# Patient Record
Sex: Female | Born: 1960 | Race: Black or African American | Hispanic: No | Marital: Single | Smoking: Former smoker
Health system: Southern US, Community
[De-identification: ages and names within clinical notes are randomized; demographics above are authoritative.]

## PROBLEM LIST (undated history)

## (undated) DIAGNOSIS — I10 Essential (primary) hypertension: Secondary | ICD-10-CM

## (undated) DIAGNOSIS — E119 Type 2 diabetes mellitus without complications: Secondary | ICD-10-CM

## (undated) HISTORY — PX: ABDOMINAL HYSTERECTOMY: SHX81

## (undated) HISTORY — PX: KNEE SURGERY: SHX244

## (undated) HISTORY — PX: OTHER SURGICAL HISTORY: SHX169

---

## 2021-06-17 ENCOUNTER — Emergency Department (HOSPITAL_BASED_OUTPATIENT_CLINIC_OR_DEPARTMENT_OTHER): Payer: 59

## 2021-06-17 ENCOUNTER — Other Ambulatory Visit: Payer: Self-pay

## 2021-06-17 ENCOUNTER — Emergency Department (HOSPITAL_BASED_OUTPATIENT_CLINIC_OR_DEPARTMENT_OTHER)
Admission: EM | Admit: 2021-06-17 | Discharge: 2021-06-17 | Disposition: A | Discharge status: Home / Self Care | Payer: 59 | Attending: Emergency Medicine | Admitting: Emergency Medicine

## 2021-06-17 ENCOUNTER — Encounter (HOSPITAL_BASED_OUTPATIENT_CLINIC_OR_DEPARTMENT_OTHER): Payer: Self-pay

## 2021-06-17 DIAGNOSIS — E119 Type 2 diabetes mellitus without complications: Secondary | ICD-10-CM | POA: Diagnosis not present

## 2021-06-17 DIAGNOSIS — I10 Essential (primary) hypertension: Secondary | ICD-10-CM | POA: Insufficient documentation

## 2021-06-17 DIAGNOSIS — U071 COVID-19: Secondary | ICD-10-CM | POA: Insufficient documentation

## 2021-06-17 DIAGNOSIS — R059 Cough, unspecified: Secondary | ICD-10-CM | POA: Diagnosis present

## 2021-06-17 HISTORY — DX: Essential (primary) hypertension: I10

## 2021-06-17 HISTORY — DX: Type 2 diabetes mellitus without complications: E11.9

## 2021-06-17 MED ORDER — NIRMATRELVIR/RITONAVIR (PAXLOVID)TABLET: 3.0000 | ORAL_TABLET | Freq: Two times a day (BID) | ORAL | 0 refills | Status: AC

## 2021-06-17 NOTE — ED Notes (Signed)
ED Provider at bedside. 

## 2021-06-17 NOTE — ED Provider Notes (Signed)
[facility] EMERGENCY DEPARTMENT Provider Note   CSN: [id number] Arrival date & time: 06/17/21  2113     History  Chief Complaint  Patient presents with   Covid Positive    Gidget Quizhpi is a 61 y.o. female.  Patient is a 61 year old female with past medical history of diabetes, hypertension, obesity.  Patient presenting today for evaluation of URI symptoms.  She describes congestion, cough, and fever for the past 3 days.  She had a positive COVID test 2 days ago.  She denies to me that she is short of breath or having any chest pain.  There are no aggravating or alleviating factors.  The history is provided by the patient.      Home Medications Prior to Admission medications   Not on File      Allergies    Patient has no known allergies.    Review of Systems   Review of Systems  All other systems reviewed and are negative.  Physical Exam Updated Vital Signs BP (!) 181/97 (BP Location: Left Arm)    Pulse 70    Temp 98.9 F (37.2 C) (Oral)    Resp 18    SpO2 92%  Physical Exam Vitals and nursing note reviewed.  Constitutional:      General: She is not in acute distress.    Appearance: She is well-developed. She is not diaphoretic.  HENT:     Head: Normocephalic and atraumatic.  Cardiovascular:     Rate and Rhythm: Normal rate and regular rhythm.     Heart sounds: No murmur heard.   No friction rub. No gallop.  Pulmonary:     Effort: Pulmonary effort is normal. No respiratory distress.     Breath sounds: Normal breath sounds. No wheezing.  Abdominal:     General: Bowel sounds are normal. There is no distension.     Palpations: Abdomen is soft.     Tenderness: There is no abdominal tenderness.  Musculoskeletal:        General: Normal range of motion.     Cervical back: Normal range of motion and neck supple.  Skin:    General: Skin is warm and dry.  Neurological:     General: No focal deficit present.     Mental Status: She is alert and oriented  to person, place, and time.    ED Results / Procedures / Treatments   Labs (all labs ordered are listed, but only abnormal results are displayed) Labs Reviewed - No data to display  EKG None  Radiology DG Chest Portable 1 View  Result Date: 06/17/2021 CLINICAL DATA:  Positive COVID test, shortness of breath. EXAM: PORTABLE CHEST 1 VIEW COMPARISON:  PA and lateral chest 04/07/2021. FINDINGS: The heart is moderately enlarged. There is mild vascular distension without overt subpleural edema. Haziness in the left-greater-than-right lower lung fields is noted and could be due to pneumonitis or ground-glass edema, alternatively low lung volumes. No pleural effusion is seen. There is mild elevation of the right hemidiaphragm. Stable mediastinal configuration. IMPRESSION: 1. Cardiomegaly with mild vascular distention. 2. Increased haziness left-greater-than-right lower lung fields, could be due to low lung volumes, pneumonitis or ground-glass edema. PA and lateral study would be helpful. Electronically Signed   By: Almira Bar M.D.   On: 06/17/2021 22:34    Procedures Procedures    Medications Ordered in ED Medications - No data to display  ED Course/ Medical Decision Making/ A&P  Patient presenting with URI symptoms and  positive COVID test 2 days ago.  Her vital signs are stable and she appears clinically well.  Chest x-ray does show haziness in the lower lung fields which I suspect is related to body habitus.  Her lungs are clear on exam.  Patient seems appropriate for treatment with Paxlovid and discharge.  Final Clinical Impression(s) / ED Diagnoses Final diagnoses:  None    Rx / DC Orders ED Discharge Orders     None         Geoffery Lyons, MD 06/17/21 2342

## 2021-06-17 NOTE — ED Notes (Addendum)
Attempted to recheck BP-pt screamed "it's too tight take it off"-BP cuff removed-pt to ED [id number]

## 2021-06-17 NOTE — Discharge Instructions (Addendum)
Begin taking Paxlovid as prescribed.  Take over-the-counter medications as needed for symptom relief.  Drink plenty of fluids and get plenty of rest.  Return to the emergency department if you develop severe chest pain, difficulty breathing, or other new and concerning symptoms.

## 2021-06-17 NOTE — ED Triage Notes (Signed)
Pt reports +covid test 3 days per the [facility] protocol-c/o cough/SOB/fever started today=DOE-steady gait

## 2021-07-14 ENCOUNTER — Encounter (HOSPITAL_COMMUNITY): Payer: Self-pay | Admitting: Radiology

## 2023-01-26 IMAGING — DX DG CHEST 1V PORT
1 series · 1 of 1 positions shown · non-contrast
Comparison: PA and lateral chest 04/07/2021.

CLINICAL DATA: Positive COVID test, shortness of breath.

EXAM:
PORTABLE CHEST 1 VIEW

[chest ap]
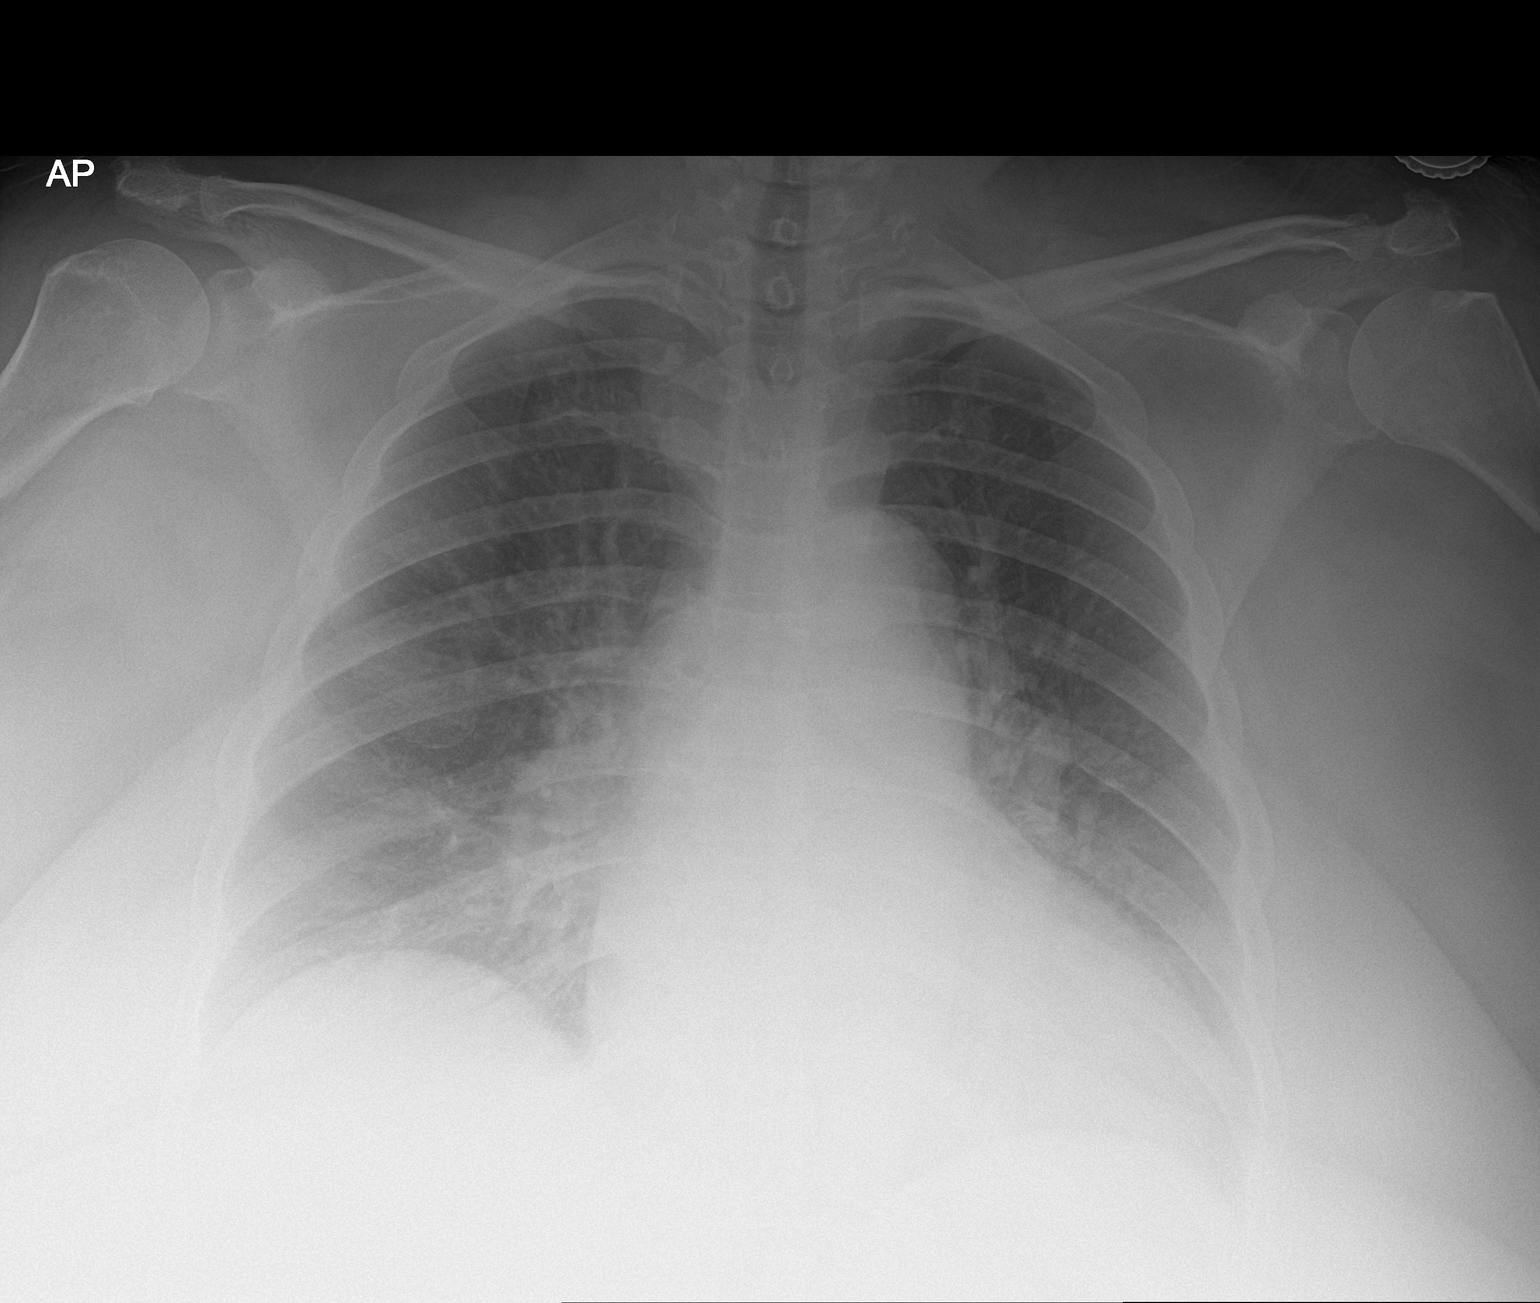

[1 of 1 positions shown; findings below may reference images not displayed]

FINDINGS: The heart is moderately enlarged. There is mild vascular distension
without overt subpleural edema. Haziness in the
left-greater-than-right lower lung fields is noted and could be due
to pneumonitis or ground-glass edema, alternatively low lung
volumes.

No pleural effusion is seen. There is mild elevation of the right
hemidiaphragm. Stable mediastinal configuration.
IMPRESSION: 1. Cardiomegaly with mild vascular distention.
2. Increased haziness left-greater-than-right lower lung fields,
could be due to low lung volumes, pneumonitis or ground-glass edema.
PA and lateral study would be helpful.
